# Patient Record
Sex: Male | Born: 1995 | Race: White | Hispanic: No | Marital: Married | Smoking: Never smoker
Health system: Southern US, Community
[De-identification: ages and names within clinical notes are randomized; demographics above are authoritative.]

## PROBLEM LIST (undated history)

## (undated) HISTORY — PX: HERNIA REPAIR: SHX51

---
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
## 2014-06-11 ENCOUNTER — Emergency Department (HOSPITAL_COMMUNITY): Payer: Managed Care, Other (non HMO)

## 2014-06-11 ENCOUNTER — Emergency Department (HOSPITAL_COMMUNITY)
Admission: EM | Admit: 2014-06-11 | Discharge: 2014-06-11 | Disposition: A | Discharge status: Home / Self Care | Payer: Managed Care, Other (non HMO) | Attending: Emergency Medicine | Admitting: Emergency Medicine

## 2014-06-11 ENCOUNTER — Encounter (HOSPITAL_COMMUNITY): Payer: Self-pay | Admitting: *Deleted

## 2014-06-11 DIAGNOSIS — Z791 Long term (current) use of non-steroidal anti-inflammatories (NSAID): Secondary | ICD-10-CM | POA: Insufficient documentation

## 2014-06-11 DIAGNOSIS — Y9389 Activity, other specified: Secondary | ICD-10-CM | POA: Insufficient documentation

## 2014-06-11 DIAGNOSIS — S6991XA Unspecified injury of right wrist, hand and finger(s), initial encounter: Secondary | ICD-10-CM | POA: Diagnosis not present

## 2014-06-11 DIAGNOSIS — W19XXXA Unspecified fall, initial encounter: Secondary | ICD-10-CM

## 2014-06-11 DIAGNOSIS — Y998 Other external cause status: Secondary | ICD-10-CM | POA: Diagnosis not present

## 2014-06-11 DIAGNOSIS — Y9289 Other specified places as the place of occurrence of the external cause: Secondary | ICD-10-CM | POA: Insufficient documentation

## 2014-06-11 DIAGNOSIS — M25531 Pain in right wrist: Secondary | ICD-10-CM

## 2014-06-11 DIAGNOSIS — Z79899 Other long term (current) drug therapy: Secondary | ICD-10-CM | POA: Diagnosis not present

## 2014-06-11 MED ORDER — IBUPROFEN 600 MG PO TABS: 600.0000 mg | ORAL_TABLET | Freq: Three times a day (TID) | ORAL | Status: DC | PRN

## 2014-06-11 NOTE — Discharge Instructions (Signed)

## 2014-06-11 NOTE — ED Provider Notes (Signed)
CSN: [id number]     Arrival date & time 06/11/14  0215 History  This chart was scribed for Austin Co, MD by Murriel Hopper, ED Scribe. This patient was seen in room A04C/A04C and the patient's care was started at 2:37 AM.    Chief Complaint  Patient presents with  . Wrist Injury   The history is provided by the patient. No language interpreter was used.     HPI Comments: Austin Hurley is a 19 y.o. male who presents to the Emergency Department complaining of a right wrist injury that occurred immediately PTA while pt was ice skating. Pt states that he fell backwards and tried to brace his fall by catching himself. Pt states that his right wrist bent backwards when this occurred, and he began to have pain in his right wrist. Pain is mild in severity and worse with palpation.  History reviewed. No pertinent past medical history. Past Surgical History  Procedure Laterality Date  . Hernia repair     No family history on file. History  Substance Use Topics  . Smoking status: Never Smoker   . Smokeless tobacco: Not on file  . Alcohol Use: No    Review of Systems  A complete 10 system review of systems was obtained and all systems are negative except as noted in the HPI and PMH.    Allergies  Review of patient's allergies indicates no known allergies.  Home Medications   Prior to Admission medications   Medication Sig Start Date End Date Taking? Authorizing Provider  buPROPion (WELLBUTRIN) 100 MG tablet Take 100 mg by mouth daily.    Historical Provider, MD  citalopram (CELEXA) 10 MG tablet Take 10 mg by mouth daily.    Historical Provider, MD  HYDROcodone-acetaminophen (NORCO/VICODIN) 5-325 MG per tablet Take 1 tablet by mouth every 4 (four) hours as needed for moderate pain. 02/11/13   Phill Mutter Dammen, PA-C  naproxen (NAPROSYN) 500 MG tablet Take 1 tablet (500 mg total) by mouth 2 (two) times daily. 02/11/13   Phill Mutter Dammen, PA-C   BP 114/67 mmHg  Pulse 74  Temp(Src) 98.9 F  (37.2 C)  Resp 16  Ht 5' 7.5" (1.715 m)  Wt 138 lb (62.596 kg)  BMI 21.28 kg/m2  SpO2 100% Physical Exam  Constitutional: He is oriented to person, place, and time. He appears well-developed and well-nourished.  HENT:  Head: Normocephalic.  Eyes: EOM are normal.  Neck: Normal range of motion.  Pulmonary/Chest: Effort normal.  Abdominal: He exhibits no distension.  Musculoskeletal: Normal range of motion.  Tenderness of right anatomical snuff box Tenderness of 2nd, 3rd, or 4th metacarpals Mild swelling over 2nd metacarpal  Neurological: He is alert and oriented to person, place, and time.  Psychiatric: He has a normal mood and affect.  Nursing note and vitals reviewed.   ED Course  Procedures (including critical care time)  DIAGNOSTIC STUDIES: Oxygen Saturation is 100% on RA, normal by my interpretation.    COORDINATION OF CARE: 2:40 AM Discussed treatment plan with pt at bedside and pt agreed to plan.   Labs Review Labs Reviewed - No data to display  Imaging Review Dg Wrist Complete Right  06/11/2014   CLINICAL DATA:  Fall with lateral right wrist pain. Initial encounter.  EXAM: RIGHT WRIST - COMPLETE 3+ VIEW  COMPARISON:  None.  FINDINGS: There is no evidence of fracture or dislocation. There is no evidence of arthropathy or other focal bone abnormality. Soft tissues are unremarkable.  IMPRESSION: Negative.   Electronically Signed   By: Marnee SpringJonathon  Watts M.D.   On: 06/11/2014 03:40   Dg Hand Complete Right  06/11/2014   CLINICAL DATA:  Trauma from fall with right hand pain. Initial encounter.  EXAM: RIGHT HAND - COMPLETE 3+ VIEW  COMPARISON:  None.  FINDINGS: There is no evidence of fracture or dislocation. There is no evidence of arthropathy or other focal bone abnormality. Soft tissues are unremarkable.  IMPRESSION: Negative.   Electronically Signed   By: Marnee SpringJonathon  Watts M.D.   On: 06/11/2014 03:39  I personally reviewed the imaging tests through PACS system I reviewed  available ER/hospitalization records through the EMR    EKG Interpretation None      MDM   Final diagnoses:  None    X-rays without obvious acute fracture.  Given his scaphoid tenderness cervical fracture is possible.  Patient be placed in a thumb spica and asked to follow-up with hand surgery for repeat evaluation and likely repeat x-rays in 1 week.  Patient understands return to the ER for new or worsening symptoms.  No other complaints.  I personally performed the services described in this documentation, which was scribed in my presence. The recorded information has been reviewed and is accurate.      Austin CoKevin M Amare Kontos, MD 06/11/14 [phone]

## 2014-06-11 NOTE — ED Notes (Signed)
The pt is c/o rt wrist and  Rt ahnd pain since he fell ice skating.  Minimal swelling. Ice on the  arm

## 2014-06-11 NOTE — ED Notes (Signed)
Pt. Left with all belongings and refused wheelchair 

## 2014-11-20 ENCOUNTER — Other Ambulatory Visit (HOSPITAL_COMMUNITY): Payer: Self-pay | Admitting: Respiratory Therapy

## 2014-11-20 DIAGNOSIS — R0600 Dyspnea, unspecified: Secondary | ICD-10-CM

## 2014-11-24 ENCOUNTER — Encounter (HOSPITAL_COMMUNITY): Payer: Managed Care, Other (non HMO)

## 2014-12-04 ENCOUNTER — Ambulatory Visit (HOSPITAL_COMMUNITY)
Admission: RE | Admit: 2014-12-04 | Discharge: 2014-12-04 | Disposition: A | Discharge status: Home / Self Care | Payer: Managed Care, Other (non HMO) | Source: Ambulatory Visit | Attending: Family Medicine | Admitting: Family Medicine

## 2014-12-04 DIAGNOSIS — R0600 Dyspnea, unspecified: Secondary | ICD-10-CM | POA: Diagnosis present

## 2014-12-04 LAB — PULMONARY FUNCTION TEST
DL/VA % pred: 112 %
DL/VA: 5.08 ml/min/mmHg/L
DLCO UNC: 26.01 ml/min/mmHg
DLCO unc % pred: 91 %
FEF 25-75 Post: 4.82 L/sec
FEF 25-75 Pre: 4.33 L/sec
FEF2575-%Change-Post: 11 %
FEF2575-%PRED-POST: 103 %
FEF2575-%Pred-Pre: 92 %
FEV1-%CHANGE-POST: 3 %
FEV1-%PRED-PRE: 89 %
FEV1-%Pred-Post: 92 %
FEV1-POST: 3.96 L
FEV1-PRE: 3.83 L
FEV1FVC-%Change-Post: 8 %
FEV1FVC-%Pred-Pre: 101 %
FEV6-%Change-Post: -4 %
FEV6-%Pred-Post: 83 %
FEV6-%Pred-Pre: 88 %
FEV6-POST: 4.29 L
FEV6-PRE: 4.5 L
FEV6FVC-%Change-Post: 0 %
FEV6FVC-%PRED-PRE: 99 %
FEV6FVC-%Pred-Post: 100 %
FVC-%CHANGE-POST: -4 %
FVC-%PRED-POST: 84 %
FVC-%Pred-Pre: 88 %
FVC-POST: 4.3 L
FVC-Pre: 4.51 L
POST FEV1/FVC RATIO: 92 %
PRE FEV6/FVC RATIO: 100 %
Post FEV6/FVC ratio: 100 %
Pre FEV1/FVC ratio: 85 %
RV % PRED: 26 %
RV: 0.33 L
TLC % pred: 75 %
TLC: 4.75 L

## 2014-12-04 MED ORDER — ALBUTEROL SULFATE (2.5 MG/3ML) 0.083% IN NEBU
2.5000 mg | INHALATION_SOLUTION | Freq: Once | RESPIRATORY_TRACT | Status: AC
  Administered 2014-12-04: 2.5 mg via RESPIRATORY_TRACT

## 2015-12-14 ENCOUNTER — Ambulatory Visit (INDEPENDENT_AMBULATORY_CARE_PROVIDER_SITE_OTHER): Payer: PPO | Admitting: Sports Medicine

## 2015-12-14 ENCOUNTER — Encounter: Payer: Self-pay | Admitting: Sports Medicine

## 2015-12-14 VITALS — BP 120/61 | Ht 67.5 in | Wt 143.0 lb

## 2015-12-14 DIAGNOSIS — S46211A Strain of muscle, fascia and tendon of other parts of biceps, right arm, initial encounter: Secondary | ICD-10-CM

## 2015-12-14 MED ORDER — MELOXICAM 15 MG PO TABS: ORAL_TABLET | ORAL | 2 refills | Status: DC

## 2015-12-14 NOTE — Progress Notes (Signed)
   Subjective:    Patient ID: John GiovanniBenjamin Kotecki, male    DOB: 1996-02-14, 20 y.o.   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 He first began to experience some discomfort and fatigue in the biceps muscle towards the end of last season. He noticed that his pain was worse with serving so he took a long break away from tennis over the summer. Upon his return to school and return to tennis, he noticed a return of pain that was also associated with some decreased speed and accuracy on his serve. A week ago he had a sudden onset of "achy" pain that he localizes to the anterior shoulder. He also describes a feeling of the tendon "rolling around". Some stiffness with the posterior shoulder as well. He has had several treatments in the training room at school. He has not been taking any medicine for his shoulder pain. He denies any significant problems with his shoulder in the past. No prior shoulder surgeries.  Past medical history reviewed Medications reviewed Allergies reviewed    Review of Systems    as above Objective:   Physical Exam  Well-developed, fit appearing. No acute distress. Vital signs reviewed  Right shoulder: Full range of motion. Slight tenderness to palpation over the proximal biceps tendon in the bicipital groove but not marked. No palpable subluxation or dislocation. No ecchymosis. No soft tissue swelling. Rotator cuff strength is 5/5. No signs of impingement. Negative speed, negative Yergason's. Negative O'Briens. Slight scapular dyskinesis noted with shoulder abduction. Neurovascularly intact distally.  MSK ultrasound of the right shoulder was performed. Biceps tendon was well visualized in the bicipital groove. There is some mild hypoechoic changes at the musculotendinous junction in the deep fibers of the tendon, but otherwise  tendon is unremarkable. Tendon does not sublux or dislocate with dynamic testing. Visualized supraspinatus, infraspinatus, and subscapularis were unremarkable. Acromioclavicular joint is also unremarkable.      Assessment & Plan:   Right shoulder pain secondary to proximal biceps tendinitis  Meloxicam 15 mg for the next 5 days. He is educated in scapular stabilization and rotator cuff exercises. I think there is something subtle in his serving form and tennis that is playing a role in his reoccurring discomfort. I would like for the patient to return to the clinic next week for consultation with Dr. Darrick PennaFields given Dr. Darrick PennaFields' expertise in tennis and tennis-related injuries.

## 2015-12-19 ENCOUNTER — Encounter: Payer: Self-pay | Admitting: Sports Medicine

## 2015-12-19 ENCOUNTER — Ambulatory Visit (INDEPENDENT_AMBULATORY_CARE_PROVIDER_SITE_OTHER): Payer: PPO | Admitting: Sports Medicine

## 2015-12-19 ENCOUNTER — Other Ambulatory Visit: Payer: Self-pay

## 2015-12-19 VITALS — BP 99/53 | Ht 67.5 in | Wt 140.0 lb

## 2015-12-19 DIAGNOSIS — M25511 Pain in right shoulder: Secondary | ICD-10-CM

## 2015-12-19 NOTE — Progress Notes (Signed)
   Subjective:    Patient ID: Austin Hurley, male    DOB: Nov 17, 1995, 20 y.o.   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 He was seen by Dr. Margaretha Sheffieldraper on 12/14/2015. His pain has been ongoing for just under two weeks. He notices the pain when he serves - specifically in a "kick serve". Other activities in which he finds pain are reaching up and back to put his hair in a bun and also putting backpack strap on. He has some mild pain when he leans on his right arm. Also notes increased crepitus in the joint. Does not wake up at night with pain. He has been taking meloxicam for 3 days and performing scapular stabilization and rotator cuff exercises. He has noted mild improvement. He has not tried to serve tennis ball.   Past medical history reviewed Medications reviewed Allergies reviewed  Review of Systems No fevers, chills, night sweats, weight loss, chest pain, or shortness of breath.     Objective:   Physical Exam Today's Vitals   12/19/15 1033  BP: (!) 99/53  Weight: 63.5 kg (140 lb)  Height: 5' 7.5" (1.715 m)   Right shoulder:  Inspection reveals mildly lower height of RT shoulder and trapezius muscle and hemihypertrophy RT arm 1.5 in longer Mild tenderness to palpation on anterior aspect. ROM is full in all planes. Rotator cuff strength normal throughout. Mild pain on cross arm test. Speeds and Yergason's tests normal. Mild pain with Obrien's, negative clunk and good stability. No painful arc and no drop arm sign.    Ultrasound of Shoulder-RT  BT short-NL BT long-NL Supraspinatus tendon-NL Subscapularis tendon-NL Infraspinatus tendon_ NL Teres Minor tendon--NL AC joint NL  Summary and Additional findings- dynamic testing show some impingement of a supraspinatous under the acromion but only when the forearm and  hand are pronated; mild hypoechoic changes are noted.  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 -Continue scapular stabilization and rotator cuff exercises. -Advised to change tennis serve for now including avoiding pronation in the overhead serve.  Griffin BasilJennifer Saranne Crislip, MD  Internal Medicine PGY-3 12/19/15 11:48 AM   I observed and examined the patient with the resident and agree with assessment and plan.  Note reviewed and modified by me.  Sterling BigKB Fields, MD

## 2015-12-28 ENCOUNTER — Emergency Department (HOSPITAL_COMMUNITY)
Admission: EM | Admit: 2015-12-28 | Discharge: 2015-12-28 | Disposition: A | Discharge status: Home / Self Care | Payer: PPO | Attending: Emergency Medicine | Admitting: Emergency Medicine

## 2015-12-28 ENCOUNTER — Encounter (HOSPITAL_COMMUNITY): Payer: Self-pay

## 2015-12-28 ENCOUNTER — Emergency Department (HOSPITAL_COMMUNITY): Payer: PPO

## 2015-12-28 DIAGNOSIS — N5082 Scrotal pain: Secondary | ICD-10-CM | POA: Diagnosis not present

## 2015-12-28 DIAGNOSIS — N50819 Testicular pain, unspecified: Secondary | ICD-10-CM | POA: Diagnosis present

## 2015-12-28 DIAGNOSIS — R52 Pain, unspecified: Secondary | ICD-10-CM

## 2015-12-28 LAB — URINALYSIS, ROUTINE W REFLEX MICROSCOPIC
BILIRUBIN URINE: NEGATIVE
Glucose, UA: NEGATIVE mg/dL
Hgb urine dipstick: NEGATIVE
KETONES UR: NEGATIVE mg/dL
LEUKOCYTES UA: NEGATIVE
Nitrite: NEGATIVE
PH: 6.5 (ref 5.0–8.0)
Protein, ur: NEGATIVE mg/dL
Specific Gravity, Urine: 1.011 (ref 1.005–1.030)

## 2015-12-28 NOTE — ED Triage Notes (Signed)
Pt here with testicular pain.  Pt went to care center this morning and told to come here to r/o torsion.  Pt denies swelling and discharged.  Does play active tennis.

## 2015-12-28 NOTE — ED Notes (Signed)
Patient wanting something to drink. Made patient aware that until we get ultrasound done and results he can't have anything to eat or drink.  Patient asking ETA on US.  Called US and was told they are down to one machine and unsure on how much longer.

## 2015-12-28 NOTE — ED Provider Notes (Signed)
[facility]-EMERGENCY DEPT Provider Note   CSN: [id number] Arrival date & time: 12/28/15  1242     History   Chief Complaint Chief Complaint  Patient presents with  . Testicle Pain    HPI Austin Hurley is a 20 y.o. male.  HPI complains of pain at base of penis, dorsal aspect gradual onset last night. Today pain is spread to his scrotum bilaterally. Pain is worse with lying supine and improved with lying on his side. No fever no dysuria no discharge from penis. No abdominal pain. No other associated symptoms. He is pain is improved today over yesterday. Seen at [facility] physicians earlier today sent here for further evaluation.  History reviewed. No pertinent past medical history. Past medical history negative There are no active problems to display for this patient.   Past Surgical History:  Procedure Laterality Date  . HERNIA REPAIR         Home Medications    Prior to Admission medications   Medication Sig Start Date End Date Taking? Authorizing Provider  meloxicam (MOBIC) 15 MG tablet Take one a day for 5 days then prn Patient not taking: Reported on 12/28/2015 12/14/15   Ralene Cork, DO    Family History History reviewed. No pertinent family history.  Social History Social History  Substance Use Topics  . Smoking status: Never Smoker  . Smokeless tobacco: Never Used  . Alcohol use No   No illicit drug use  Allergies   Review of patient's allergies indicates no known allergies.   Review of Systems Review of Systems  Constitutional: Negative.   HENT: Negative.   Respiratory: Negative.   Cardiovascular: Negative.   Gastrointestinal: Negative.   Genitourinary: Positive for penile pain.       Scrotal pain  Musculoskeletal: Negative.   Skin: Negative.   Neurological: Negative.   Psychiatric/Behavioral: Negative.   All other systems reviewed and are negative.    Physical Exam Updated Vital Signs BP 140/71 (BP Location: Left Arm)   Pulse 66   Temp  98.5 F (36.9 C) (Oral)   Resp 16   SpO2 100%   Physical Exam  Constitutional: He appears well-developed and well-nourished. No distress.  HENT:  Head: Normocephalic and atraumatic.  Eyes: Conjunctivae are normal. Pupils are equal, round, and reactive to light.  Neck: Neck supple. No tracheal deviation present. No thyromegaly present.  Cardiovascular: Normal rate and regular rhythm.   No murmur heard. Pulmonary/Chest: Effort normal and breath sounds normal.  Abdominal: Soft. Bowel sounds are normal. He exhibits no distension. There is no tenderness.  Genitourinary: Penis normal.  Genitourinary Comments: No scrotal masses. No scrotal tenderness or redness. Both testes in normal lie.  Musculoskeletal: Normal range of motion. He exhibits no edema or tenderness.  Neurological: He is alert. Coordination normal.  Skin: Skin is warm and dry. No rash noted.  Psychiatric: He has a normal mood and affect.  Nursing note and vitals reviewed.    ED Treatments / Results  Labs (all labs ordered are listed, but only abnormal results are displayed) Labs Reviewed  URINALYSIS, ROUTINE W REFLEX MICROSCOPIC (NOT AT [facility])  GC/CHLAMYDIA PROBE AMP ([place]) NOT AT [facility]    EKG  EKG Interpretation None       Radiology No results found.  Procedures Procedures (including critical care time)  Medications Ordered in ED Medications - No data to display  Results for orders placed or performed during the hospital encounter of 12/28/15  Urinalysis, Routine w reflex microscopic (not  at [facility])  Result Value Ref Range   Color, Urine YELLOW YELLOW   APPearance CLEAR CLEAR   Specific Gravity, Urine 1.011 1.005 - 1.030   pH 6.5 5.0 - 8.0   Glucose, UA NEGATIVE NEGATIVE mg/dL   Hgb urine dipstick NEGATIVE NEGATIVE   Bilirubin Urine NEGATIVE NEGATIVE   Ketones, ur NEGATIVE NEGATIVE mg/dL   Protein, ur NEGATIVE NEGATIVE mg/dL   Nitrite NEGATIVE NEGATIVE   Leukocytes, UA NEGATIVE NEGATIVE   [place]  Scrotum  Result Date: 12/28/2015 CLINICAL DATA:  Left testicle pain for 12 hours. EXAM: SCROTAL ULTRASOUND DOPPLER ULTRASOUND OF THE TESTICLES TECHNIQUE: Complete ultrasound examination of the testicles, epididymis, and other scrotal structures was performed. Color and spectral Doppler ultrasound were also utilized to evaluate blood flow to the testicles. COMPARISON:  None. FINDINGS: Right testicle Measurements: 3.7 x 1.8 x 2.6 cm. No mass or microlithiasis visualized. There are echogenic foci around the testicle which are nonspecific. Left testicle Measurements: 4.1 x 2.0 x 2.7 cm. No mass or microlithiasis visualized. Right epididymis: Normal in size and appearance. Small epididymal cysts. Left epididymis:  Normal in size and appearance. Hydrocele:  Small left hydrocele. Varicocele: Left varicocele. Venous structure measures up to 0.4 cm. Pulsed Doppler interrogation of both testes demonstrates normal low resistance arterial and venous waveforms bilaterally. IMPRESSION: No testicular mass.  No testicular torsion. Left varicocele. Echogenic foci around the right testicle are nonspecific. This may represent scrotoliths or scrotal pearls. Electronically Signed   By: Richarda OverlieAdam  Henn M.D.   On: 12/28/2015 16:23   [place] Extrem Up Right Comp  Result Date: 12/19/2015 Ultrasound of Shoulder-RT BT short-NL BT long-NL Supraspinatus tendon-NL Subscapularis tendon-NL Infraspinatus tendon_ NL Teres Minor tendon--NL AC joint NL Summary and Additional findings- dynamic testing show some impingement of a supraspinatous under the acromion but only when the forearm and hand are pronated; mild hypoechoic changes are noted. 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 EXAM: SCROTAL ULTRASOUND DOPPLER ULTRASOUND OF THE TESTICLES TECHNIQUE: Complete ultrasound examination of the testicles,  epididymis, and other scrotal structures was performed. Color and spectral Doppler ultrasound were also utilized to evaluate blood flow to the testicles. COMPARISON:  None. FINDINGS: Right testicle Measurements: 3.7 x 1.8 x 2.6 cm. No mass or microlithiasis visualized. There are echogenic foci around the testicle which are nonspecific. Left testicle Measurements: 4.1 x 2.0 x 2.7 cm. No mass or microlithiasis visualized. Right epididymis: Normal in size and appearance. Small epididymal cysts. Left epididymis:  Normal in size and appearance. Hydrocele:  Small left hydrocele. Varicocele: Left varicocele. Venous structure measures up to 0.4 cm. Pulsed Doppler interrogation of both testes demonstrates normal low resistance arterial and venous waveforms bilaterally. IMPRESSION: No testicular mass.  No testicular torsion. Left varicocele. Echogenic foci around the right testicle are nonspecific. This may represent scrotoliths or scrotal pearls. Electronically Signed   By: Richarda OverlieAdam  Henn M.D.   On: 12/28/2015 16:23   Initial Impression / Assessment and Plan / ED Course  I have reviewed the triage vital signs and the nursing notes.  Pertinent labs & imaging results that were available during my care of the patient were reviewed by me and considered in my medical decision making (see chart for details).  Clinical Course    Patient declines pain medicine. Low pretest clinical probability for testicular torsion given pain at base of penis. No tenderness. Essentially  normal exam. Negative scrotal ultrasound. Plan NSAIDs. Referral [facility]  Final Clinical Impressions(s) / ED Diagnoses   Final diagnoses:  None   Diagnosis : pain in genitals New Prescriptions New Prescriptions   No medications on file     Doug Sou, MD 12/28/15 1646

## 2015-12-28 NOTE — ED Notes (Signed)
MD at bedside. 

## 2015-12-28 NOTE — Discharge Instructions (Signed)
Take Advil as directed for pain. Wear a scrotal support for comfort. Call Dr. Hyacinth MeekerMiller to be seen or [facility] if having significant discomfort in the next 7-10 days. Return if concern for any reason

## 2015-12-28 NOTE — ED Notes (Signed)
Bed: WA13 Expected date:  Expected time:  Means of arrival:  Comments: triage 

## 2015-12-30 ENCOUNTER — Encounter (HOSPITAL_COMMUNITY): Payer: Self-pay | Admitting: *Deleted

## 2015-12-30 ENCOUNTER — Emergency Department (HOSPITAL_COMMUNITY): Payer: PPO

## 2015-12-30 ENCOUNTER — Emergency Department (HOSPITAL_COMMUNITY)
Admission: EM | Admit: 2015-12-30 | Discharge: 2015-12-30 | Disposition: A | Discharge status: Home / Self Care | Payer: PPO | Attending: Emergency Medicine | Admitting: Emergency Medicine

## 2015-12-30 DIAGNOSIS — R1030 Lower abdominal pain, unspecified: Secondary | ICD-10-CM | POA: Diagnosis present

## 2015-12-30 DIAGNOSIS — R1032 Left lower quadrant pain: Secondary | ICD-10-CM | POA: Diagnosis not present

## 2015-12-30 LAB — URINALYSIS, ROUTINE W REFLEX MICROSCOPIC
BILIRUBIN URINE: NEGATIVE
Glucose, UA: NEGATIVE mg/dL
Hgb urine dipstick: NEGATIVE
KETONES UR: NEGATIVE mg/dL
LEUKOCYTES UA: NEGATIVE
Nitrite: NEGATIVE
PROTEIN: NEGATIVE mg/dL
Specific Gravity, Urine: 1.01 (ref 1.005–1.030)
pH: 7 (ref 5.0–8.0)

## 2015-12-30 MED ORDER — LIDOCAINE 5 % EX PTCH: 1.0000 | MEDICATED_PATCH | CUTANEOUS | 0 refills | Status: DC

## 2015-12-30 NOTE — ED Notes (Signed)
Pt returned from c-t  On his cell phone

## 2015-12-30 NOTE — ED Provider Notes (Signed)
[facility]-EMERGENCY DEPT Provider Note   CSN: [id number] Arrival date & time: 12/30/15  1439     History   Chief Complaint Chief Complaint  Patient presents with  . Groin Pain    HPI Austin Hurley is a 20 y.o. male who presents emergency Department with chief complaint of pain at the base of his penis. This is been ongoing for several days. He was seen 2 days ago in the ED and had a scrotal ultrasound that was negative for any acute abnormalities. Patient has had persistent pain. He states that he is unsure how to elicit the pain. However, at times it is extremely painful and sharp. He denies urinary symptoms or flank pain. He denies any penile discharge. He is sexually active in a monogamous relationship with a single partner who is his first partner. Partner both been tested for STDs previously. He denies testicular pain. He states he's had previous hernia and this does not feel the same.  HPI  History reviewed. No pertinent past medical history.  There are no active problems to display for this patient.   Past Surgical History:  Procedure Laterality Date  . HERNIA REPAIR         Home Medications    Prior to Admission medications   Medication Sig Start Date End Date Taking? Authorizing Provider  albuterol (PROVENTIL HFA;VENTOLIN HFA) 108 (90 Base) MCG/ACT inhaler Inhale 2 puffs into the lungs every 6 (six) hours as needed for wheezing or shortness of breath.   Yes Historical Provider, MD  naproxen sodium (ALEVE) 220 MG tablet Take 440-660 mg by mouth daily as needed (migraine).   Yes Historical Provider, MD  lidocaine (LIDODERM) 5 % Place 1 patch onto the skin daily. Remove & Discard patch within 12 hours or as directed by MD 12/30/15   Arthor CaptainAbigail Lashanti Chambless, PA-C  meloxicam (MOBIC) 15 MG tablet Take one a day for 5 days then prn Patient not taking: Reported on 12/30/2015 12/14/15   Ralene Corkimothy R Draper, DO    Family History History reviewed. No pertinent family history.  Social  History Social History  Substance Use Topics  . Smoking status: Never Smoker  . Smokeless tobacco: Never Used  . Alcohol use No     Allergies   Lactose intolerance (gi)   Review of Systems Review of Systems Ten systems reviewed and are negative for acute change, except as noted in the HPI.    Physical Exam Updated Vital Signs BP 130/59 (BP Location: Right Arm)   Pulse (!) 56   Temp 98.7 F (37.1 C) (Oral)   Resp 16   SpO2 100%   Physical Exam  Physical Exam  Nursing note and vitals reviewed. Constitutional: He appears well-developed and well-nourished. No distress.  HENT:  Head: Normocephalic and atraumatic.  Eyes: Conjunctivae normal are normal. No scleral icterus.  Neck: Normal range of motion. Neck supple.  Cardiovascular: Normal rate, regular rhythm and normal heart sounds.   Pulmonary/Chest: Effort normal and breath sounds normal. No respiratory distress.  Abdominal: Soft. There is no tenderness. No CVA tenderness Musculoskeletal: He exhibits no edema.  Neurological: He is alert.  Skin: Skin is warm and dry. He is not diaphoretic.  Psychiatric: His behavior is normal.  Genitourinary: Normal male anatomy. Penis circumcised without lesions or discharge. No scrotal inflammation or tenderness, bilateral testicles are nontender to palpation without masses, negative planes and normal cremasteric reflex bilaterally. No direct or indirect hernias on examination. He has exquisite tenderness to palpation at the base of  the penis on the left side. Pain is seems out of proportion to the examination.  ED Treatments / Results  Labs (all labs ordered are listed, but only abnormal results are displayed) Labs Reviewed  URINALYSIS, ROUTINE W REFLEX MICROSCOPIC (NOT AT [facility])    EKG  EKG Interpretation None       Radiology Ct Abdomen Pelvis Wo Contrast  Result Date: 12/30/2015 CLINICAL DATA:  Left inguinal region pain for 3 days EXAM: CT ABDOMEN AND PELVIS WITHOUT  CONTRAST TECHNIQUE: Multidetector CT imaging of the abdomen and pelvis was performed following the standard protocol without oral or intravenous contrast material administration. COMPARISON:  None. FINDINGS: Lower chest: Lung bases are clear. Hepatobiliary: No focal liver lesions are appreciable on this noncontrast enhanced study. Gallbladder wall is not appreciably thickened. There is no biliary duct dilatation. Pancreas: There is no pancreatic mass or inflammatory focus. Spleen: No splenic lesions are evident. Adrenals/Urinary Tract: Adrenals appear normal bilaterally. Kidneys bilaterally show no appreciable mass or hydronephrosis on either side. There is no renal or ureteral calculus on either side. Urinary bladder is midline with wall thickness within normal limits. Stomach/Bowel: There is no appreciable bowel wall or mesenteric thickening. There is no bowel obstruction. No free air or portal venous air. Vascular/Lymphatic: There is no abdominal aortic aneurysm. No vascular lesion is evident on this noncontrast enhanced study. There is no evident adenopathy in the abdomen or pelvis. Reproductive: Prostate and seminal vesicles appear normal in size and contour. There is no pelvic mass or pelvic fluid collection. Other: The appendix appears unremarkable. There is no periappendiceal region inflammation. No abscess or ascites evident. No demonstrable inguinal hernia. Musculoskeletal: There are no blastic or lytic bone lesions. No intramuscular or abdominal wall lesion. IMPRESSION: A cause for patient's symptoms has not been established with this study. No demonstrable bowel obstruction or abscess. No hernia. No periappendiceal region inflammation. No renal or ureteral calculus. No hydronephrosis. Electronically Signed   By: Bretta Bang III M.D.   On: 12/30/2015 18:14    Procedures Procedures (including critical care time)  Medications Ordered in ED Medications - No data to display   Initial  Impression / Assessment and Plan / ED Course  I have reviewed the triage vital signs and the nursing notes.  Pertinent labs & imaging results that were available during my care of the patient were reviewed by me and considered in my medical decision making (see chart for details).  Clinical Course   Patient UA is negative.  Negative CT scan. I have no specific diagnosis for the patient's complaint. I doubt any emergent cause such as intermittent torsion, as his pain is in the skin at the base of the penis. I question potential skin infection. Patient given follow up with [facility]. Final Clinical Impressions(s) / ED Diagnoses   Final diagnoses:  Groin pain, left    New Prescriptions Discharge Medication List as of 12/30/2015  6:44 PM    START taking these medications   Details  lidocaine (LIDODERM) 5 % Place 1 patch onto the skin daily. Remove & Discard patch within 12 hours or as directed by MD, Starting Sun 12/30/2015, Print         Arthor Captain, PA-C 01/01/16 0740    Lorre Nick, MD 01/07/16 [phone]

## 2015-12-30 NOTE — ED Notes (Signed)
The pt has groin pain for 2 days  He was seen [facility] where he had a us.  His pain is still there and increased since then

## 2015-12-30 NOTE — ED Triage Notes (Signed)
Pt report inner thigh, groin and scrotum pain for several days. [facility] r/o torsion. Denies difficulty urinating but states he is having nausea, denies fever.

## 2015-12-30 NOTE — ED Notes (Signed)
Pt appears cocmfortable

## 2015-12-30 NOTE — ED Notes (Signed)
Pt to ct 

## 2015-12-31 LAB — GC/CHLAMYDIA PROBE AMP (~~LOC~~) NOT AT ARMC
CHLAMYDIA, DNA PROBE: NEGATIVE
NEISSERIA GONORRHEA: NEGATIVE

## 2017-03-04 NOTE — Progress Notes (Deleted)
Psychiatric Initial Adult Assessment   Patient Identification: Austin Hurley MRN:  [id number] Date of Evaluation:  03/04/2017 Referral Source: *** Chief Complaint:   Visit Diagnosis: No diagnosis found.  History of Present Illness:   Austin GiovanniBenjamin Afzal is a 21 year old male with   Associated Signs/Symptoms: Depression Symptoms:  {DEPRESSION SYMPTOMS:20000} (Hypo) Manic Symptoms:  {BHH MANIC SYMPTOMS:22872} Anxiety Symptoms:  {BHH ANXIETY SYMPTOMS:22873} Psychotic Symptoms:  {BHH PSYCHOTIC SYMPTOMS:22874} PTSD Symptoms: {BHH PTSD SYMPTOMS:22875}  Past Psychiatric History:  Outpatient:  Psychiatry admission:  Previous suicide attempt:  Past trials of medication:  History of violence:   Previous Psychotropic Medications: {YES/NO:21197}  Substance Abuse History in the last 12 months:  {yes no:314532}  Consequences of Substance Abuse: {BHH CONSEQUENCES OF SUBSTANCE ABUSE:22880}  Past Medical History: No past medical history on file.  Past Surgical History:  Procedure Laterality Date  . HERNIA REPAIR      Family Psychiatric History: ***  Family History: No family history on file.  Social History:   Social History   Socioeconomic History  . Marital status: Single    Spouse name: Not on file  . Number of children: Not on file  . Years of education: Not on file  . Highest education level: Not on file  Social Needs  . Financial resource strain: Not on file  . Food insecurity - worry: Not on file  . Food insecurity - inability: Not on file  . Transportation needs - medical: Not on file  . Transportation needs - non-medical: Not on file  Occupational History  . Not on file  Tobacco Use  . Smoking status: Never Smoker  . Smokeless tobacco: Never Used  Substance and Sexual Activity  . Alcohol use: No  . Drug use: No  . Sexual activity: No  Other Topics Concern  . Not on file  Social History Narrative  . Not on file    Additional Social History:  ***  Allergies:   Allergies  Allergen Reactions  . Lactose Intolerance (Gi) Diarrhea and Other (See Comments)    Gas/ bloating    Metabolic Disorder Labs: No results found for: HGBA1C, MPG No results found for: PROLACTIN No results found for: CHOL, TRIG, HDL, CHOLHDL, VLDL, LDLCALC   Current Medications: Current Outpatient Medications  Medication Sig Dispense Refill  . albuterol (PROVENTIL HFA;VENTOLIN HFA) 108 (90 Base) MCG/ACT inhaler Inhale 2 puffs into the lungs every 6 (six) hours as needed for wheezing or shortness of breath.    . lidocaine (LIDODERM) 5 % Place 1 patch onto the skin daily. Remove & Discard patch within 12 hours or as directed by MD 30 patch 0  . meloxicam (MOBIC) 15 MG tablet Take one a day for 5 days then prn (Patient not taking: Reported on 12/30/2015) 30 tablet 2  . naproxen sodium (ALEVE) 220 MG tablet Take 440-660 mg by mouth daily as needed (migraine).     No current facility-administered medications for this visit.     Neurologic: Headache: No Seizure: No Paresthesias:No  Musculoskeletal: Strength & Muscle Tone: within normal limits Gait & Station: normal Patient leans: N/A  Psychiatric Specialty Exam: ROS  There were no vitals taken for this visit.There is no height or weight on file to calculate BMI.  General Appearance: Fairly Groomed  Eye Contact:  Good  Speech:  Clear and Coherent  Volume:  Normal  Mood:  {BHH MOOD:22306}  Affect:  {Affect (PAA):22687}  Thought Process:  Coherent and Goal Directed  Orientation:  Full (Time, Place,  and Person)  Thought Content:  Logical  Suicidal Thoughts:  {ST/HT (PAA):22692}  Homicidal Thoughts:  {ST/HT (PAA):22692}  Memory:  Immediate;   Good Recent;   Good Remote;   Good  Judgement:  {Judgement (PAA):22694}  Insight:  {Insight (PAA):22695}  Psychomotor Activity:  Normal  Concentration:  Concentration: Good and Attention Span: Good  Recall:  Good  Fund of Knowledge:Good  Language: Good   Akathisia:  No  Handed:  Right  AIMS (if indicated):  N/A  Assets:  Communication Skills Desire for Improvement  ADL's:  Intact  Cognition: WNL  Sleep:  ***   Assessment  Plan  The patient demonstrates the following risk factors for suicide: Chronic risk factors for suicide include: {Chronic Risk Factors for WUJWJXB:14782956}Suicide:30414011}. Acute risk factors for suicide include: {Acute Risk Factors for OZHYQMV:78469629}Suicide:30414012}. Protective factors for this patient include: {Protective Factors for Suicide BMWU:13244010}Risk:30414013}. Considering these factors, the overall suicide risk at this point appears to be {Desc; low/moderate/high:110033}. Patient {ACTION; IS/IS UVO:53664403}OT:21021397} appropriate for outpatient follow up.   Treatment Plan Summary: Plan as above   Neysa Hottereina Malekai Markwood, MD 11/28/20181:04 PM

## 2017-03-10 ENCOUNTER — Telehealth (HOSPITAL_COMMUNITY): Payer: Self-pay | Admitting: *Deleted

## 2017-03-10 ENCOUNTER — Ambulatory Visit (HOSPITAL_COMMUNITY): Payer: PRIVATE HEALTH INSURANCE | Admitting: Psychiatry

## 2017-03-10 NOTE — Telephone Encounter (Signed)
left voice message, provider out of office 03/10/17. 

## 2017-09-17 IMAGING — CT CT ABD-PELV W/O CM
2 of 4 series · 10 of 46 positions shown, 11 images · non-contrast
Comparison: None.

CLINICAL DATA: Left inguinal region pain for 3 days

EXAM:
CT ABDOMEN AND PELVIS WITHOUT CONTRAST
TECHNIQUE: Multidetector CT imaging of the abdomen and pelvis was performed
following the standard protocol without oral or intravenous contrast
material administration.

[Series 201: routine, idose (2) · axial · 0.76mm/px · z∈[+32,+462]mm · 7 of 104 slices shown, 8 images]
[im 9/104  soft-tissue]
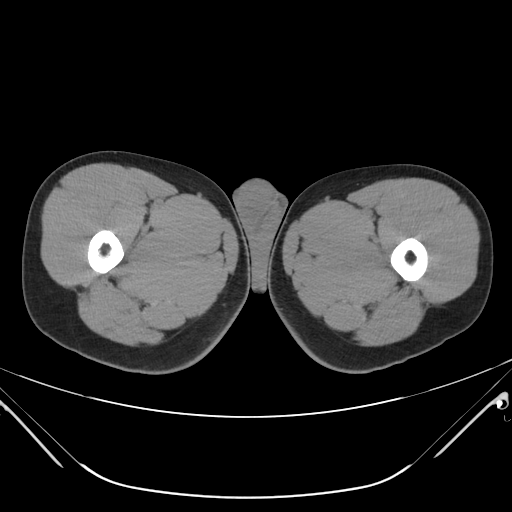
[im 9/104  bone]
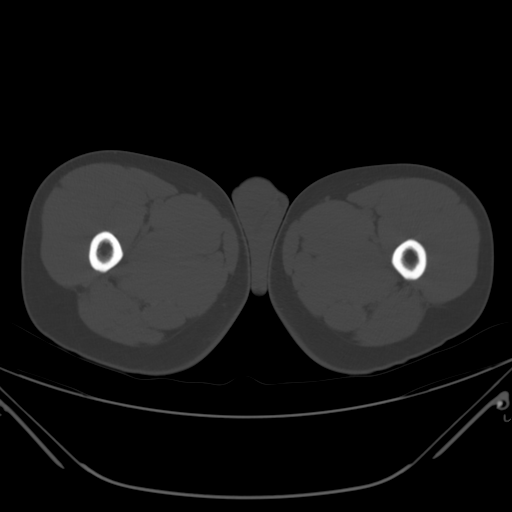
[im 22/104  soft-tissue]
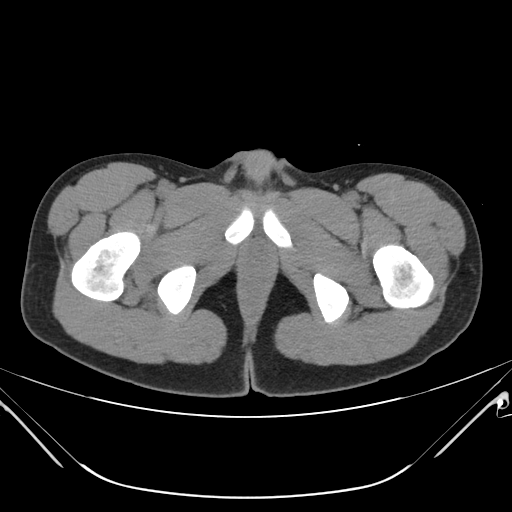
[im 39/104  soft-tissue]
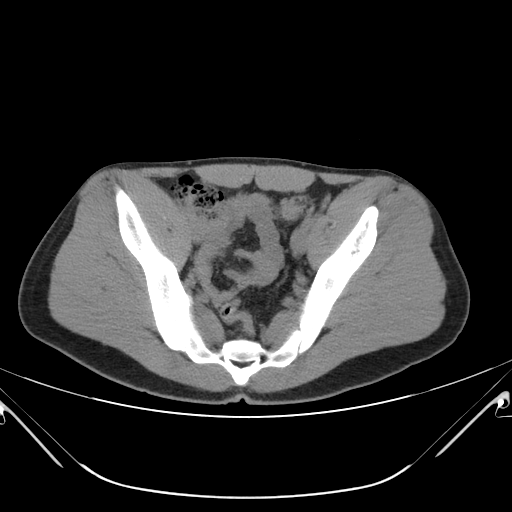
[im 52/104  soft-tissue]
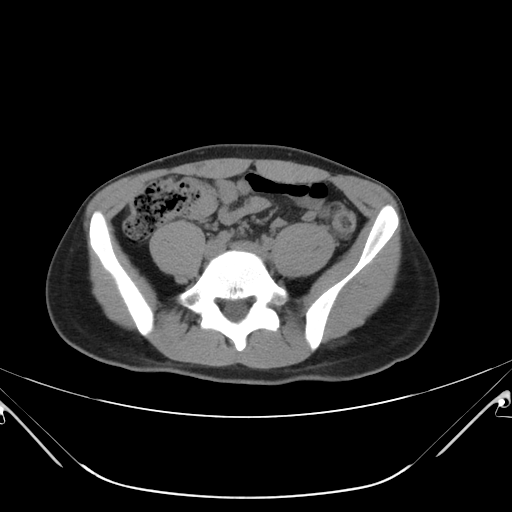
[im 65/104  soft-tissue]
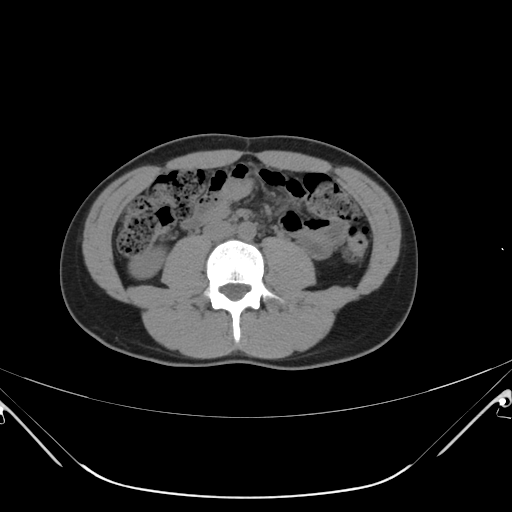
[im 82/104  soft-tissue]
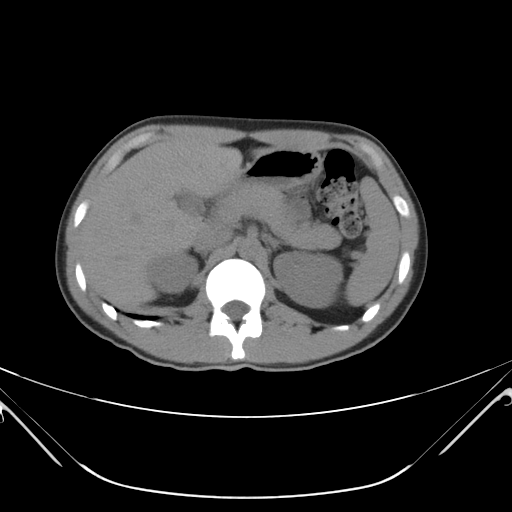
[im 95/104  soft-tissue]
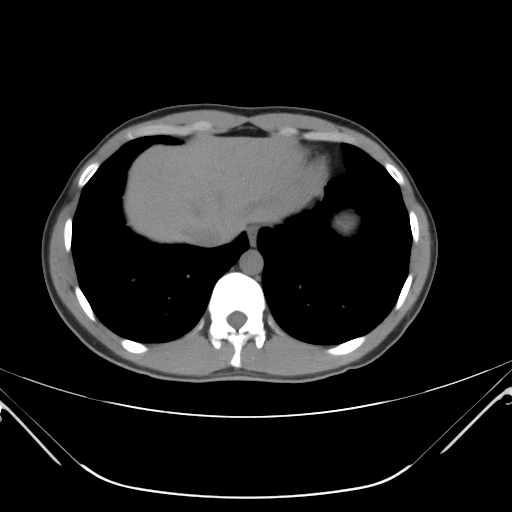

[Series 203: coronals, idose (2) · coronal · 0.45mm/px · 3 of 127 slices shown]
[im 43/127  soft-tissue]
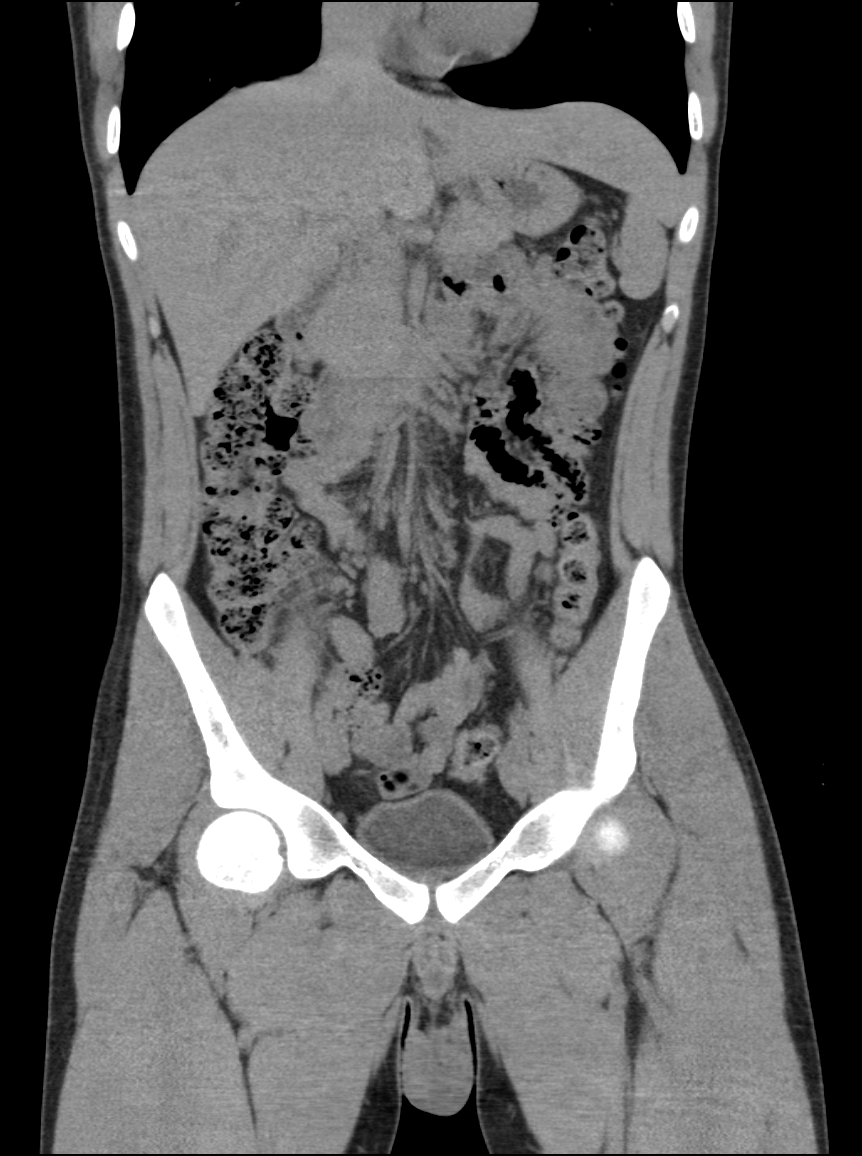
[im 57/127  soft-tissue]
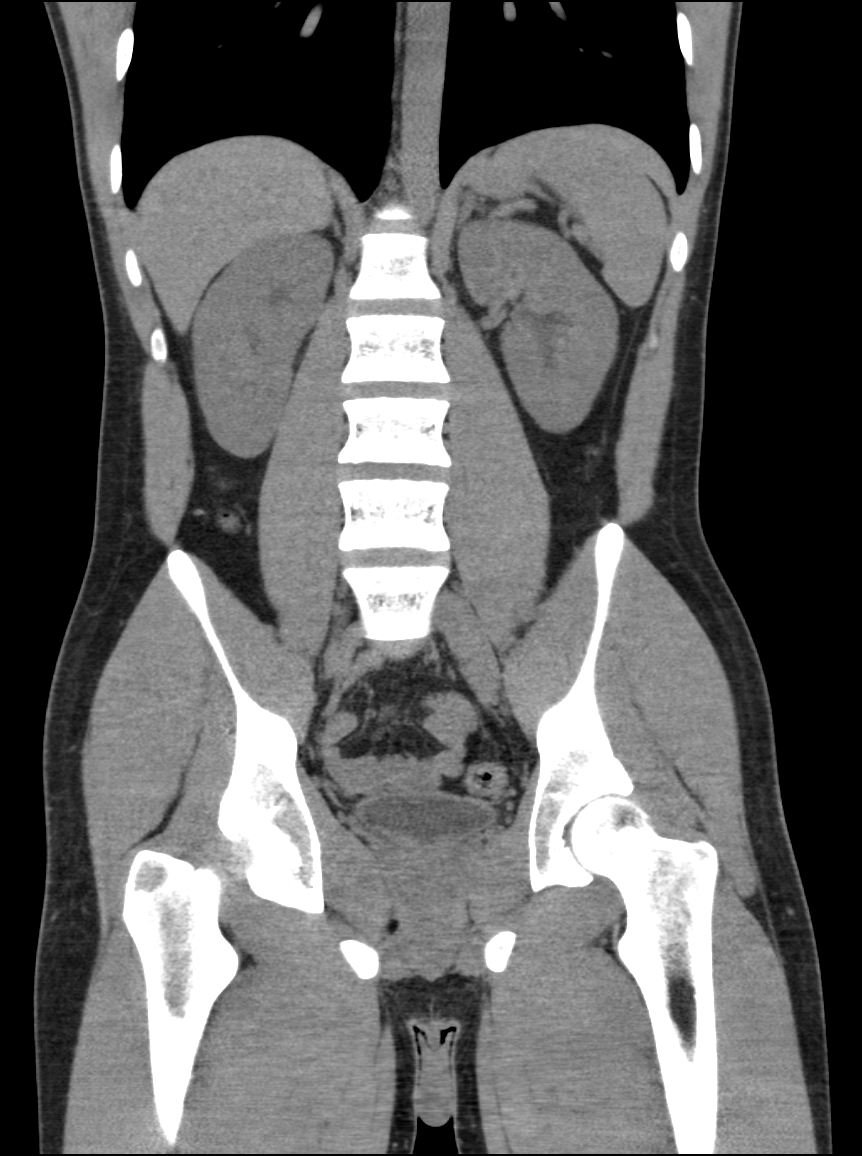
[im 71/127  soft-tissue]
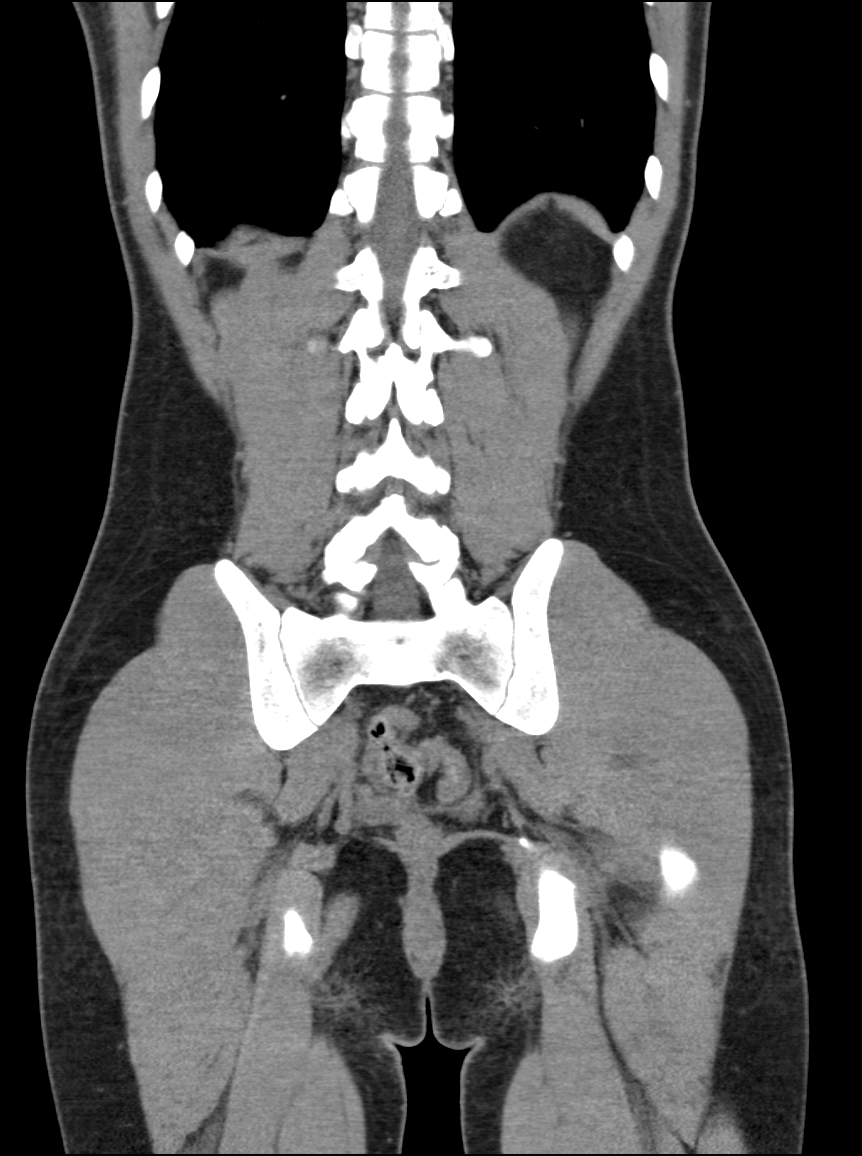

[10 of 46 positions shown; findings below may reference images not displayed]

FINDINGS: Lower chest: Lung bases are clear.

Hepatobiliary: No focal liver lesions are appreciable on this
noncontrast enhanced study. Gallbladder wall is not appreciably
thickened. There is no biliary duct dilatation.

Pancreas: There is no pancreatic mass or inflammatory focus.

Spleen: No splenic lesions are evident.

Adrenals/Urinary Tract: Adrenals appear normal bilaterally. Kidneys
bilaterally show no appreciable mass or hydronephrosis on either
side. There is no renal or ureteral calculus on either side. Urinary
bladder is midline with wall thickness within normal limits.

Stomach/Bowel: There is no appreciable bowel wall or mesenteric
thickening. There is no bowel obstruction. No free air or portal
venous air.

Vascular/Lymphatic: There is no abdominal aortic aneurysm. No
vascular lesion is evident on this noncontrast enhanced study. There
is no evident adenopathy in the abdomen or pelvis.

Reproductive: Prostate and seminal vesicles appear normal in size
and contour. There is no pelvic mass or pelvic fluid collection.

Other: The appendix appears unremarkable. There is no
periappendiceal region inflammation. No abscess or ascites evident.
No demonstrable inguinal hernia.

Musculoskeletal: There are no blastic or lytic bone lesions. No
intramuscular or abdominal wall lesion.
IMPRESSION: A cause for patient's symptoms has not been established with this
study. No demonstrable bowel obstruction or abscess. No hernia. No
periappendiceal region inflammation. No renal or ureteral calculus.
No hydronephrosis.

## 2024-05-06 ENCOUNTER — Encounter: Payer: Self-pay | Admitting: Family Medicine

## 2024-05-06 ENCOUNTER — Ambulatory Visit: Admitting: Family Medicine

## 2024-05-06 VITALS — BP 145/73 | HR 99 | Ht 67.0 in | Wt 166.0 lb

## 2024-05-06 DIAGNOSIS — R0683 Snoring: Secondary | ICD-10-CM | POA: Insufficient documentation

## 2024-05-06 DIAGNOSIS — E782 Mixed hyperlipidemia: Secondary | ICD-10-CM | POA: Insufficient documentation

## 2024-05-06 DIAGNOSIS — K122 Cellulitis and abscess of mouth: Secondary | ICD-10-CM | POA: Diagnosis not present

## 2024-05-06 MED ORDER — AMOXICILLIN-POT CLAVULANATE 875-125 MG PO TABS: 1.0000 | ORAL_TABLET | Freq: Two times a day (BID) | ORAL | 0 refills | Status: AC

## 2024-05-06 NOTE — Assessment & Plan Note (Signed)
 Augmentin  for a week.

## 2024-05-06 NOTE — Assessment & Plan Note (Signed)
Will refer to Pulmonology for sleep study.

## 2024-05-06 NOTE — Progress Notes (Signed)
 "  New Patient Office Visit  Subjective    Patient ID: Austin Hurley, male    DOB: [birth year]  Age: 29 y.o. MRN: [id number]  CC:  Chief Complaint  Patient presents with   New Patient (Initial Visit)    My psychiatrist recommended I get a sleep study, but I also need to establish care with a new doctor as my wife and I recently moved to [facility]. My wife is due to have our first child in July, and I want to make sure I am taking care of myself.    HPI Austin Hurley presents to establish care Discussed the use of AI scribe software for clinical note transcription with the patient, who gave verbal consent to proceed.  History of Present Illness   Austin Hurley is a 29 year old male who presents with concerns about sleep quality and potential sleep apnea.  He has experienced poor sleep quality over the past four years, characterized by waking up without feeling rested and lacking energy. 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 Despite using a 'happy light', eating well, and exercising, his sleep quality remains suboptimal. His wife has observed episodes where he appears to stop breathing during sleep, raising concerns about possible sleep apnea.  There is a family history of sleep apnea, as his father is currently undergoing sleep study evaluations and his mother has sleep apnea.    He takes 10 mg of rosuvastatin once daily in the evening for high cholesterol, 200 mg of sertraline once daily in the evening, and 54 mg of methylphenidate in the morning. He has not used his albuterol  inhaler for years.  His diet is high in fiber, and he tries to limit red meat intake. He takes fish oil daily but has reduced fish consumption due to his wife's pregnancy-related aversion to its smell. He exercises less than he would like, aiming to engage in more strenuous activities like basketball or soccer twice a week.  His uvula is swollen, feels dry and sore.   He has had this for a couple of days now.  No fever or sore throat      Outpatient Encounter Medications as of 05/06/2024  Medication Sig   amoxicillin -clavulanate (AUGMENTIN ) 875-125 MG tablet Take 1 tablet by mouth 2 (two) times daily.   methylphenidate 36 MG PO CR tablet Take 36 mg by mouth daily.   rosuvastatin (CRESTOR) 10 MG tablet Take 10 mg by mouth at bedtime.   sertraline (ZOLOFT) 100 MG tablet Take 100 mg by mouth 2 (two) times daily.   [DISCONTINUED] albuterol  (PROVENTIL  HFA;VENTOLIN  HFA) 108 (90 Base) MCG/ACT inhaler Inhale 2 puffs into the lungs every 6 (six) hours as needed for wheezing or shortness of breath.   [DISCONTINUED] lidocaine  (LIDODERM ) 5 % Place 1 patch onto the skin daily. Remove & Discard patch within 12 hours or as directed by MD   [DISCONTINUED] meloxicam  (MOBIC ) 15 MG tablet Take one a day for 5 days then prn (Patient not taking: Reported on 12/30/2015)   [DISCONTINUED] naproxen  sodium (ALEVE ) 220 MG tablet Take 440-660 mg by mouth daily as needed (migraine).   No facility-administered encounter medications on file as of 05/06/2024.    History reviewed. No pertinent past medical history.  Past Surgical History:  Procedure Laterality Date   HERNIA REPAIR      History reviewed. No pertinent family history.  Social History   Socioeconomic History   Marital status: Married  Spouse name: Not on file   Number of children: Not on file   Years of education: Not on file   Highest education level: Master's degree (e.g., MA, MS, MEng, MEd, MSW, MBA)  Occupational History   Not on file  Tobacco Use   Smoking status: Never   Smokeless tobacco: Never  Substance and Sexual Activity   Alcohol use: No   Drug use: No   Sexual activity: Never  Other Topics Concern   Not on file  Social History Narrative   Not on file   Social Drivers of Health   Tobacco Use: Low Risk (05/06/2024)   Patient History    Smoking Tobacco Use: Never    Smokeless Tobacco Use:  Never    Passive Exposure: Not on file  Financial Resource Strain: Medium Risk (05/04/2024)   Overall Financial Resource Strain (CARDIA)    Difficulty of Paying Living Expenses: Somewhat hard  Food Insecurity: No Food Insecurity (05/04/2024)   Epic    Worried About Radiation Protection Practitioner of Food in the Last Year: Never true    Ran Out of Food in the Last Year: Never true  Transportation Needs: No Transportation Needs (05/04/2024)   Epic    Lack of Transportation (Medical): No    Lack of Transportation (Non-Medical): No  Physical Activity: Insufficiently Active (05/04/2024)   Exercise Vital Sign    Days of Exercise per Week: 3 days    Minutes of Exercise per Session: 20 min  Stress: No Stress Concern Present (05/04/2024)   [employer] of Occupational Health - Occupational Stress Questionnaire    Feeling of Stress: Only a little  Social Connections: Moderately Isolated (05/04/2024)   Social Connection and Isolation Panel    Frequency of Communication with Friends and Family: Twice a week    Frequency of Social Gatherings with Friends and Family: Once a week    Attends Religious Services: Never    Database Administrator or Organizations: No    Attends Engineer, Structural: Not on file    Marital Status: Married  Catering Manager Violence: Not on file  Depression (EYV7-0): Not on file  Alcohol Screen: Low Risk (05/04/2024)   Alcohol Screen    Last Alcohol Screening Score (AUDIT): 1  Housing: Low Risk (05/04/2024)   Epic    Unable to Pay for Housing in the Last Year: No    Number of Times Moved in the Last Year: 1    Homeless in the Last Year: No  Utilities: Low Risk (12/10/2023)   Received from [facility]   Utilities    Within the past 12 months, have you been unable to get utilities (heat, electricity) when it was really needed?: No  Health Literacy: Not on file    ROS      Objective   BP (!) 145/73   Pulse 99   Ht 5' 7 (1.702 m)   Wt 166 lb (75.3 kg)    SpO2 97%   BMI 26.00 kg/m    Physical Exam Vitals and nursing note reviewed.  Constitutional:      Appearance: Normal appearance.  HENT:     Head: Normocephalic and atraumatic.     Mouth/Throat:     Mouth: Mucous membranes are moist.     Comments: Uvula edematous and erythematous.   Eyes:     Conjunctiva/sclera: Conjunctivae normal.  Cardiovascular:     Rate and Rhythm: Normal rate and regular rhythm.  Pulmonary:     Effort:  Pulmonary effort is normal.     Breath sounds: Normal breath sounds.  Musculoskeletal:     Right lower leg: No edema.     Left lower leg: No edema.  Skin:    General: Skin is warm and dry.  Neurological:     Mental Status: He is alert and oriented to person, place, and time.  Psychiatric:        Mood and Affect: Mood normal.        Behavior: Behavior normal.        Thought Content: Thought content normal.        Judgment: Judgment normal.            The ASCVD Risk score (Arnett DK, et al., 2019) failed to calculate for the following reasons:   The 2019 ASCVD risk score is only valid for ages 94 to 62   * - Cholesterol units were assumed     Assessment & Plan:  Snores Assessment & Plan: Will refer to Pulmonology for sleep study  Orders: -     Pulmonary Visit  Mixed hyperlipidemia Assessment & Plan: Is on Crestor 10mg  a week.  Will check his fasting labs.    Orders: -     Lipid panel; Future -     CMP14+EGFR; Future  Uvulitis Assessment & Plan: Augmentin  for a week.    Orders: -     Amoxicillin -Pot Clavulanate; Take 1 tablet by mouth 2 (two) times daily.  Dispense: 14 tablet; Refill: 0    Return in about 6 months (around 11/03/2024).   Stacie Knutzen K Wakeelah Solan, MD  "

## 2024-05-06 NOTE — Assessment & Plan Note (Signed)
 Is on Crestor 10mg  a week.  Will check his fasting labs.

## 2024-05-10 ENCOUNTER — Telehealth: Payer: Self-pay | Admitting: Family Medicine

## 2024-05-10 NOTE — Telephone Encounter (Signed)
 He has not started the antibiotics yet.  His uvula is swollen and bloody.  He was thinking it might be just dry air and he tried a humidifier but that did not help.  He is not running a fever.  Wants to know if he needs a strep test.  Advised that strep test is not necessary because his tonsils are not involved.  Advised uvulitis is usually bacterial.  If he takes one dose of antibiotic and is noticeably improved, that is a good  indication the infection is bacterial.

## 2024-05-10 NOTE — Telephone Encounter (Signed)
 Copied from CRM #[id number]. Topic: General - Other >> May 10, 2024  9:41 AM Antony RAMAN wrote: Reason for CRM: patient called back to speak with dr ziglar, told him she will try again later. Patient said he has not filled the rx yet to make sure he's not taking the antibiotic unnecessarily, he wanted to make sure it was actually bacterial

## 2024-05-10 NOTE — Telephone Encounter (Signed)
 Called to answer his questions about why I chose augmentin  for his uvulitis.  LM that I would call again later in the day.

## 2024-05-12 ENCOUNTER — Ambulatory Visit

## 2024-05-12 ENCOUNTER — Other Ambulatory Visit: Payer: Self-pay

## 2024-05-12 DIAGNOSIS — E782 Mixed hyperlipidemia: Secondary | ICD-10-CM

## 2024-05-13 ENCOUNTER — Other Ambulatory Visit

## 2024-05-13 ENCOUNTER — Encounter: Payer: Self-pay | Admitting: Internal Medicine

## 2024-05-20 ENCOUNTER — Ambulatory Visit: Admitting: Family Medicine

## 2024-05-23 ENCOUNTER — Ambulatory Visit: Admitting: Sleep Medicine

## 2024-11-03 ENCOUNTER — Ambulatory Visit: Admitting: Family Medicine
